# Patient Record
Sex: Male | Born: 1935 | Race: Black or African American | Hispanic: No
Health system: Southern US, Community
[De-identification: ages and names within clinical notes are randomized; demographics above are authoritative.]

---

## 1998-03-01 ENCOUNTER — Inpatient Hospital Stay (HOSPITAL_COMMUNITY): Admission: AD | Admit: 1998-03-01 | Discharge: 1998-03-10 | Payer: Self-pay | Admitting: Cardiology

## 2002-03-15 ENCOUNTER — Ambulatory Visit (HOSPITAL_COMMUNITY): Admission: RE | Admit: 2002-03-15 | Discharge: 2002-03-15 | Payer: Self-pay | Admitting: Family Medicine

## 2002-04-13 ENCOUNTER — Ambulatory Visit (HOSPITAL_COMMUNITY): Admission: RE | Admit: 2002-04-13 | Discharge: 2002-04-13 | Payer: Self-pay | Admitting: General Surgery

## 2002-12-13 ENCOUNTER — Inpatient Hospital Stay (HOSPITAL_COMMUNITY): Admission: EM | Admit: 2002-12-13 | Discharge: 2002-12-22 | Payer: Self-pay | Admitting: Emergency Medicine

## 2002-12-13 ENCOUNTER — Encounter: Payer: Self-pay | Admitting: Emergency Medicine

## 2002-12-15 ENCOUNTER — Encounter: Payer: Self-pay | Admitting: Family Medicine

## 2003-06-14 ENCOUNTER — Emergency Department (HOSPITAL_COMMUNITY): Admission: EM | Admit: 2003-06-14 | Discharge: 2003-06-14 | Payer: Self-pay | Admitting: Emergency Medicine

## 2004-09-06 ENCOUNTER — Inpatient Hospital Stay (HOSPITAL_COMMUNITY): Admission: EM | Admit: 2004-09-06 | Discharge: 2004-09-11 | Payer: Self-pay | Admitting: *Deleted

## 2004-09-10 ENCOUNTER — Ambulatory Visit: Payer: Self-pay | Admitting: Oncology

## 2006-02-16 IMAGING — NM NM BONE WHOLE BODY
2 series · 2 of 2 positions shown · non-contrast
Comparison: none

CLINICAL DATA: Kidney mass.  Rule out metastases.
 RADIOISOTOPIC WHOLE BODY BONE SCAN:
 After the intravenous injection of 25 millicuries of technetium 55m-labeled MDP, whole body scans of the skeleton were made and show no definite metastatic disease within the skeleton.  The region of the upper sternum and manubrium show slightly prominent activity as do the shoulders and knees suggestive of arthritic change.  The ribs, spine, pelvis, and skull appear to be within normal limits.

[Series 1: total body · 5.57mm/px · 1 of 1 slices shown (1 of 2)]
[im 1/1]
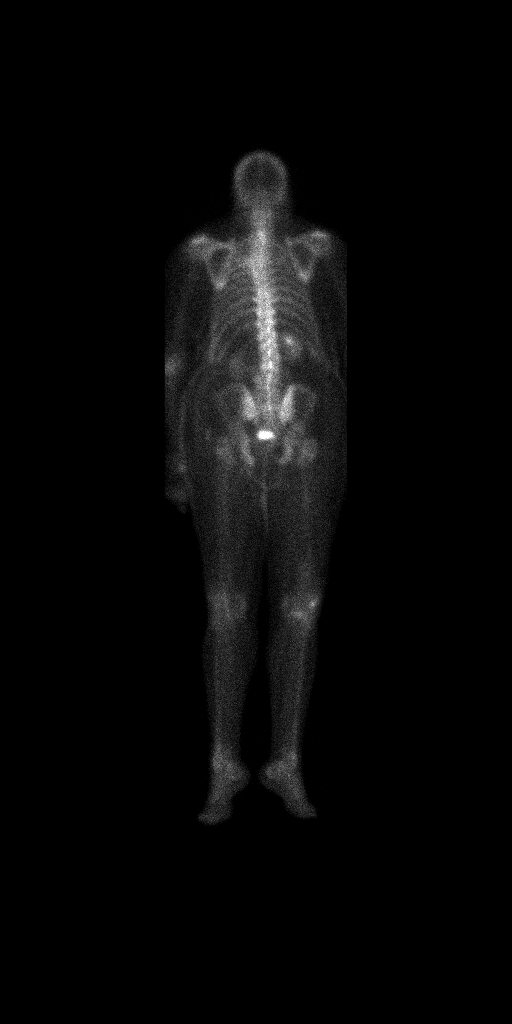

[Series 1: total body · 5.57mm/px · 1 of 1 slices shown (2 of 2)]
[im 1/1]
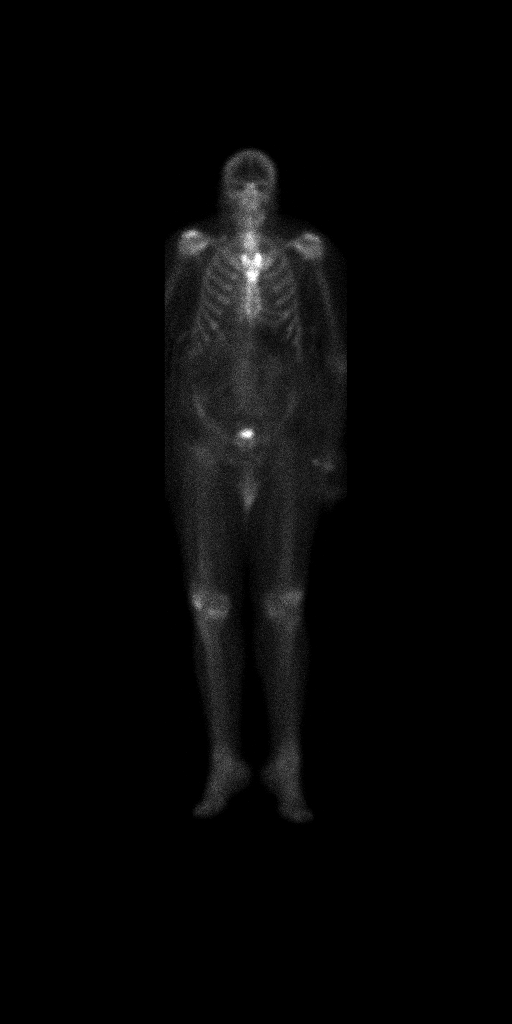

[2 of 2 positions shown; findings below may reference images not displayed]

IMPRESSION: No evidence of metastatic disease.  Some prominence of the shoulder and knee joints suggestive arthritic change.  There is also mild symmetrical prominence of activity in the upper sternum and manubrial area.

## 2014-05-10 ENCOUNTER — Telehealth: Payer: Self-pay

## 2014-05-15 NOTE — Telephone Encounter (Signed)
error
